# Patient Record
Sex: Male | Born: 1960 | Race: Black or African American | Hispanic: No | Marital: Married | Smoking: Current some day smoker
Health system: Southern US, Community
[De-identification: ages and names within clinical notes are randomized; demographics above are authoritative.]

## PROBLEM LIST (undated history)

## (undated) DIAGNOSIS — R7303 Prediabetes: Secondary | ICD-10-CM

## (undated) DIAGNOSIS — J449 Chronic obstructive pulmonary disease, unspecified: Secondary | ICD-10-CM

## (undated) DIAGNOSIS — E785 Hyperlipidemia, unspecified: Secondary | ICD-10-CM

## (undated) HISTORY — PX: CARPAL TUNNEL RELEASE: SHX101

## (undated) HISTORY — DX: Chronic obstructive pulmonary disease, unspecified: J44.9

## (undated) HISTORY — DX: Prediabetes: R73.03

## (undated) HISTORY — PX: ELBOW SURGERY: SHX618

## (undated) HISTORY — PX: APPENDECTOMY: SHX54

## (undated) HISTORY — DX: Hyperlipidemia, unspecified: E78.5

## (undated) HISTORY — PX: LUMBAR FUSION: SHX111

---

## 2009-12-06 ENCOUNTER — Inpatient Hospital Stay (HOSPITAL_COMMUNITY): Admission: RE | Admit: 2009-12-06 | Discharge: 2009-12-08 | Payer: Self-pay | Admitting: Neurosurgery

## 2010-01-09 ENCOUNTER — Encounter: Admission: RE | Admit: 2010-01-09 | Discharge: 2010-01-09 | Payer: Self-pay | Admitting: Neurosurgery

## 2010-04-17 ENCOUNTER — Encounter
Admission: RE | Admit: 2010-04-17 | Discharge: 2010-04-17 | Payer: Self-pay | Source: Home / Self Care | Attending: Neurosurgery | Admitting: Neurosurgery

## 2010-06-13 LAB — SURGICAL PCR SCREEN: Staphylococcus aureus: POSITIVE — AB

## 2010-06-13 LAB — CBC
MCH: 31.3 pg (ref 26.0–34.0)
MCHC: 35.1 g/dL (ref 30.0–36.0)
MCV: 89.2 fL (ref 78.0–100.0)
Platelets: 203 10*3/uL (ref 150–400)
RDW: 13.3 % (ref 11.5–15.5)

## 2010-09-01 ENCOUNTER — Other Ambulatory Visit: Payer: Self-pay | Admitting: Neurosurgery

## 2010-09-01 DIAGNOSIS — M5410 Radiculopathy, site unspecified: Secondary | ICD-10-CM

## 2010-09-04 ENCOUNTER — Other Ambulatory Visit: Payer: Self-pay

## 2010-09-04 ENCOUNTER — Inpatient Hospital Stay: Admission: RE | Admit: 2010-09-04 | Payer: Self-pay | Source: Ambulatory Visit

## 2012-02-25 ENCOUNTER — Other Ambulatory Visit: Payer: Self-pay | Admitting: Neurosurgery

## 2012-02-25 DIAGNOSIS — M5410 Radiculopathy, site unspecified: Secondary | ICD-10-CM

## 2012-03-15 ENCOUNTER — Ambulatory Visit
Admission: RE | Admit: 2012-03-15 | Discharge: 2012-03-15 | Disposition: A | Discharge status: Home / Self Care | Payer: Medicare Other | Source: Ambulatory Visit | Attending: Neurosurgery | Admitting: Neurosurgery

## 2012-03-15 DIAGNOSIS — M5410 Radiculopathy, site unspecified: Secondary | ICD-10-CM

## 2012-03-15 MED ORDER — GADOBENATE DIMEGLUMINE 529 MG/ML IV SOLN: 19.0000 mL | Freq: Once | INTRAVENOUS | Status: AC | PRN

## 2012-04-19 ENCOUNTER — Ambulatory Visit
Admission: RE | Admit: 2012-04-19 | Discharge: 2012-04-19 | Disposition: A | Discharge status: Home / Self Care | Payer: Medicare Other | Source: Ambulatory Visit | Attending: Neurosurgery | Admitting: Neurosurgery

## 2012-04-19 MED ORDER — GADOBENATE DIMEGLUMINE 529 MG/ML IV SOLN
18.0000 mL | Freq: Once | INTRAVENOUS | Status: AC | PRN
  Administered 2012-04-19: 18 mL via INTRAVENOUS

## 2012-11-26 ENCOUNTER — Other Ambulatory Visit: Payer: Self-pay | Admitting: Neurosurgery

## 2012-11-26 DIAGNOSIS — M549 Dorsalgia, unspecified: Secondary | ICD-10-CM

## 2013-02-09 ENCOUNTER — Other Ambulatory Visit: Payer: Medicare Other

## 2013-02-10 ENCOUNTER — Ambulatory Visit
Admission: RE | Admit: 2013-02-10 | Discharge: 2013-02-10 | Disposition: A | Discharge status: Home / Self Care | Payer: Medicare Other | Source: Ambulatory Visit | Attending: Neurosurgery | Admitting: Neurosurgery

## 2013-02-10 DIAGNOSIS — M549 Dorsalgia, unspecified: Secondary | ICD-10-CM

## 2013-08-20 ENCOUNTER — Encounter (HOSPITAL_COMMUNITY): Payer: Self-pay | Admitting: Emergency Medicine

## 2013-08-20 ENCOUNTER — Emergency Department (HOSPITAL_COMMUNITY)
Admission: EM | Admit: 2013-08-20 | Discharge: 2013-08-20 | Disposition: A | Discharge status: Home / Self Care | Payer: Medicare Other | Attending: Emergency Medicine | Admitting: Emergency Medicine

## 2013-08-20 DIAGNOSIS — S6990XA Unspecified injury of unspecified wrist, hand and finger(s), initial encounter: Secondary | ICD-10-CM

## 2013-08-20 DIAGNOSIS — IMO0002 Reserved for concepts with insufficient information to code with codable children: Secondary | ICD-10-CM | POA: Insufficient documentation

## 2013-08-20 DIAGNOSIS — Y9210 Unspecified residential institution as the place of occurrence of the external cause: Secondary | ICD-10-CM | POA: Insufficient documentation

## 2013-08-20 DIAGNOSIS — Z79899 Other long term (current) drug therapy: Secondary | ICD-10-CM | POA: Insufficient documentation

## 2013-08-20 DIAGNOSIS — S59919A Unspecified injury of unspecified forearm, initial encounter: Secondary | ICD-10-CM

## 2013-08-20 DIAGNOSIS — T148XXA Other injury of unspecified body region, initial encounter: Secondary | ICD-10-CM

## 2013-08-20 DIAGNOSIS — S4980XA Other specified injuries of shoulder and upper arm, unspecified arm, initial encounter: Secondary | ICD-10-CM | POA: Insufficient documentation

## 2013-08-20 DIAGNOSIS — S46909A Unspecified injury of unspecified muscle, fascia and tendon at shoulder and upper arm level, unspecified arm, initial encounter: Secondary | ICD-10-CM | POA: Insufficient documentation

## 2013-08-20 DIAGNOSIS — Y9389 Activity, other specified: Secondary | ICD-10-CM | POA: Insufficient documentation

## 2013-08-20 DIAGNOSIS — S59909A Unspecified injury of unspecified elbow, initial encounter: Secondary | ICD-10-CM | POA: Insufficient documentation

## 2013-08-20 DIAGNOSIS — X500XXA Overexertion from strenuous movement or load, initial encounter: Secondary | ICD-10-CM | POA: Insufficient documentation

## 2013-08-20 MED ORDER — METHOCARBAMOL 500 MG PO TABS: 500.0000 mg | ORAL_TABLET | Freq: Two times a day (BID) | ORAL | Status: DC

## 2013-08-20 MED ORDER — IBUPROFEN 800 MG PO TABS: 800.0000 mg | ORAL_TABLET | Freq: Three times a day (TID) | ORAL | Status: DC

## 2013-08-20 NOTE — ED Notes (Signed)
Reports that his son is in the hospital, he tried to pull his son up in bed and now having neck, back, arm, wrist pain. Ambulatory, no acute distress noted at triage.

## 2013-08-20 NOTE — Discharge Instructions (Signed)

## 2013-08-20 NOTE — ED Provider Notes (Signed)
Medical screening examination/treatment/procedure(s) were performed by non-physician practitioner and as supervising physician I was immediately available for consultation/collaboration.   EKG Interpretation None        Shanna Cisco, MD 08/20/13 2344

## 2013-08-20 NOTE — ED Provider Notes (Signed)
CSN: [id number]     Arrival date & time 08/20/13  1511 History   This chart was scribed for Fayrene HelperBowie Bohden Dung, PA-C, working with Shanna CiscoMegan E Docherty, MD by Blanchard KelchNicole Curnes, ED Scribe. This patient was seen in room TR05C/TR05C and the patient's care was started at 3:53 PM.     Chief Complaint  Patient presents with  . Back Pain  . Arm Pain      Patient is a 53 y.o. male presenting with back pain and arm pain. The history is provided by the patient. No language interpreter was used.  Back Pain Associated symptoms: no fever   Arm Pain    HPI Comments: Jackson Castillo is a 53 y.o. male who presents to the Emergency Department complaining of constant, worsening mid back and left arm pain that began after he pulled his son up from a hospital bed three days ago. He states that he was trying to adjust his son so he could sit higher three days ago and two days ago by pulling the son from behind the bed. He states that he had mild pain that severely worsened this morning upon waking in his back and left arm. He describes the pain as sore and tight. The pain is aggravated by holding his arm straight down. He denies taking any medication for the pain. He denies any pertinent past medical history. He has a surgical history of lower back pain. He denies any allergies.    History reviewed. No pertinent past medical history. History reviewed. No pertinent past surgical history. History reviewed. No pertinent family history. History  Substance Use Topics  . Smoking status: Not on file  . Smokeless tobacco: Not on file  . Alcohol Use: No    Review of Systems  Constitutional: Negative for fever and chills.  Musculoskeletal: Positive for back pain and myalgias.      Allergies  Review of patient's allergies indicates no known allergies.  Home Medications   Prior to Admission medications   Medication Sig Start Date End Date Taking? Authorizing Provider  atorvastatin (LIPITOR) 10 MG tablet Take 10 mg by  mouth daily.   Yes Historical Provider, MD  traZODone (DESYREL) 150 MG tablet Take 150 mg by mouth at bedtime.   Yes Historical Provider, MD   Triage Vitals: BP 129/94  Pulse 90  Temp(Src) 97.4 F (36.3 C) (Oral)  Resp 18  Ht 6' (1.829 m)  Wt 180 lb (81.647 kg)  BMI 24.41 kg/m2  SpO2 98%  Physical Exam  Nursing note and vitals reviewed. Constitutional: He is oriented to person, place, and time. He appears well-developed and well-nourished. No distress.  HENT:  Head: Normocephalic and atraumatic.  Eyes: EOM are normal.  Neck: Neck supple. No tracheal deviation present.  Cardiovascular: Normal rate.   Pulmonary/Chest: Effort normal. No respiratory distress.  Musculoskeletal: Normal range of motion. He exhibits tenderness.  Left elbow tenderness to anterior fossa that is worse with elbow extension. Tenderness to insertions of biceps brachii. Tenderness to left deltoid muscle. Tenderness to thoracic and parathoracic spinal muscles. No crepitus or stepoffs throughout mid spine.   Neurological: He is alert and oriented to person, place, and time.  Skin: Skin is warm and dry.  Psychiatric: He has a normal mood and affect. His behavior is normal.    ED Course  Procedures (including critical care time)  DIAGNOSTIC STUDIES: Oxygen Saturation is 98% on room air, normal by my interpretation.    COORDINATION OF CARE: 3:59 PM -Will discharge with  prescription for muscle relaxant. Recommend follow up with Orthopedics. Patient verbalizes understanding and agrees with treatment plan.    Labs Review Labs Reviewed - No data to display  Imaging Review No results found.   EKG Interpretation None      MDM   Final diagnoses:  Muscle strain    BP 129/94  Pulse 90  Temp(Src) 97.4 F (36.3 C) (Oral)  Resp 18  Ht 6' (1.829 m)  Wt 180 lb (81.647 kg)  BMI 24.41 kg/m2  SpO2 98%   I personally performed the services described in this documentation, which was scribed in my  presence. The recorded information has been reviewed and is accurate.     Fayrene Helper, PA-C 08/20/13 1629

## 2013-09-06 DIAGNOSIS — M5136 Other intervertebral disc degeneration, lumbar region: Secondary | ICD-10-CM | POA: Insufficient documentation

## 2013-09-06 DIAGNOSIS — M5030 Other cervical disc degeneration, unspecified cervical region: Secondary | ICD-10-CM | POA: Insufficient documentation

## 2013-12-15 ENCOUNTER — Ambulatory Visit (HOSPITAL_COMMUNITY): Payer: Self-pay | Admitting: Physician Assistant

## 2014-09-05 IMAGING — CT CT L SPINE W/O CM
4 of 10 series · 12 of 33 positions shown, 14 images · non-contrast
Comparison: Lumbar MRI 04/19/2012

CLINICAL DATA: Low back pain with right-sided radiculopathy. Lumbar
fusion

EXAM:
CT LUMBAR SPINE WITHOUT CONTRAST
TECHNIQUE: Multidetector CT imaging of the lumbar spine was performed without
intravenous contrast administration. Multiplanar CT image
reconstructions were also generated.

[Series 4: l spine bone · axial · 0.30mm/px · z∈[-17,+63]mm · 2 of 97 slices shown, 3 images]
[im 33/97  soft-tissue]
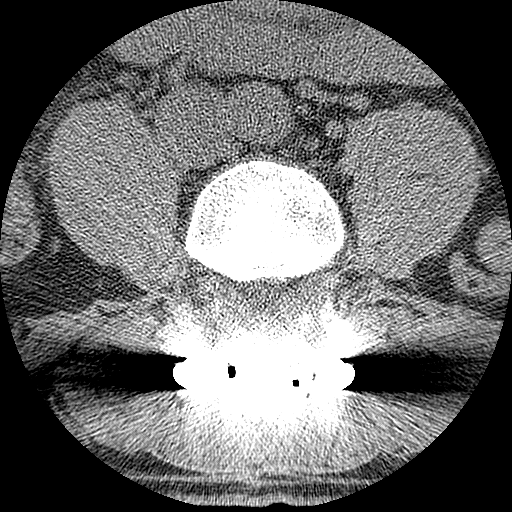
[im 33/97  bone]
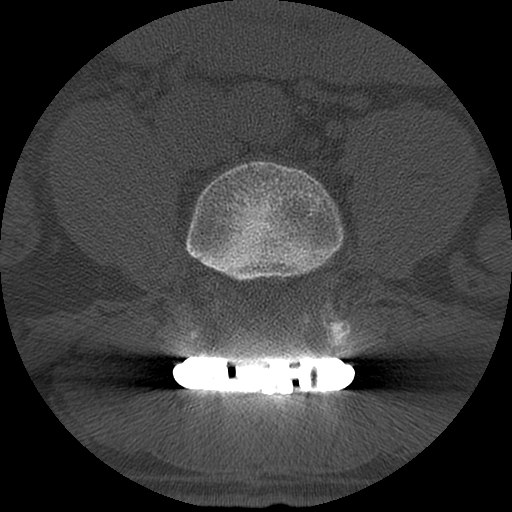
[im 65/97  bone]
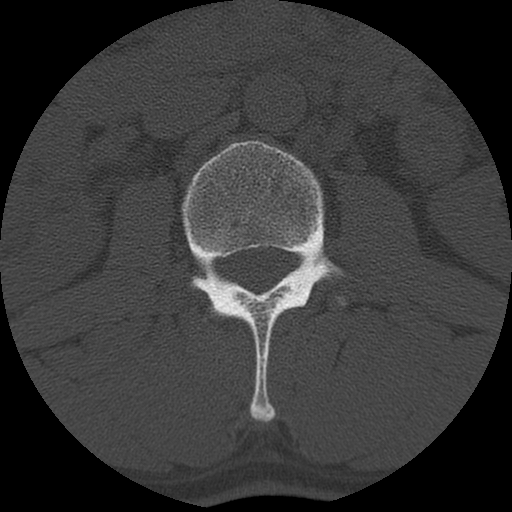

[Series 5: l spine detail · axial · 0.30mm/px · z∈[-19,+61]mm · 2 of 98 slices shown]
[im 33/98  bone]
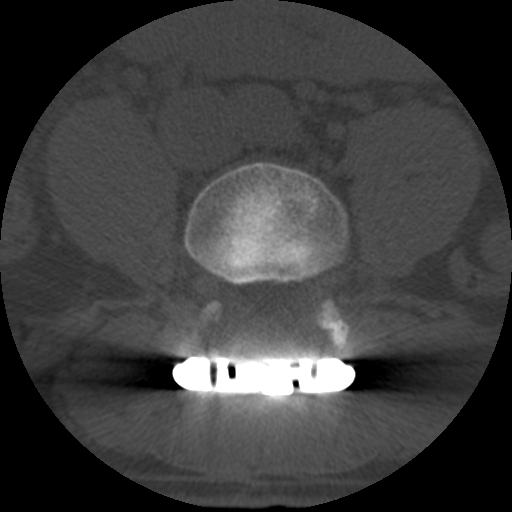
[im 65/98  bone]
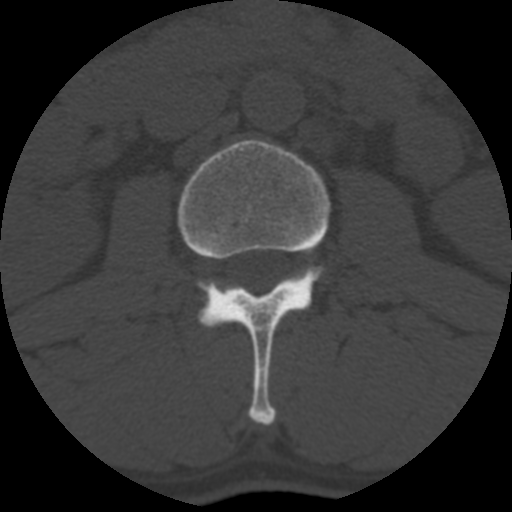

[Series 200: cor · coronal · 0.49mm/px · 3 of 58 slices shown]
[im 12/58  bone]
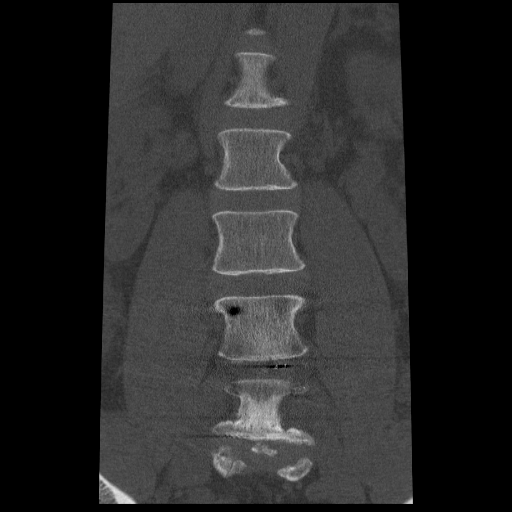
[im 23/58  bone]
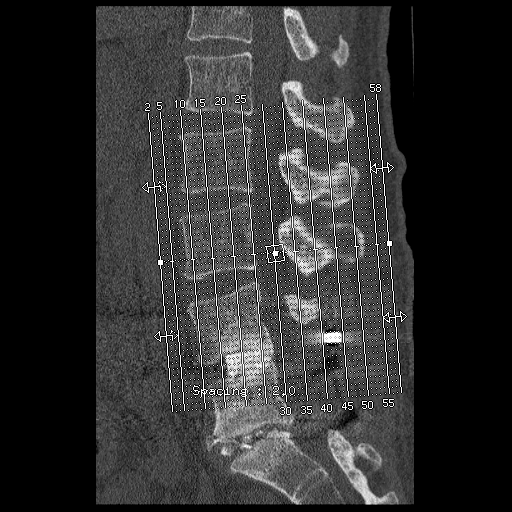
[im 35/58  bone]
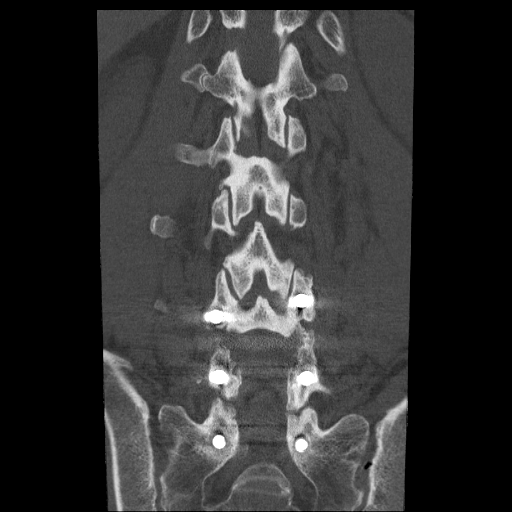

[Series 201: sag · sagittal · 0.49mm/px · 5 of 48 slices shown, 6 images]
[im 16/48  bone]
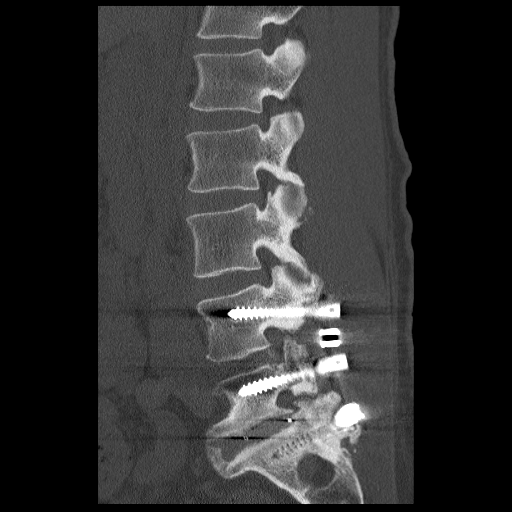
[im 20/48  bone]
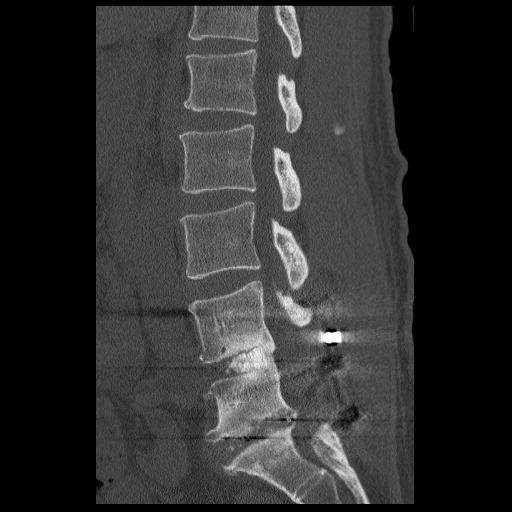
[im 24/48  soft-tissue]
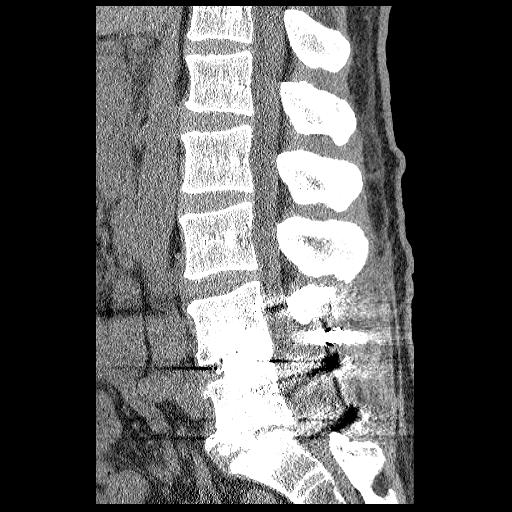
[im 24/48  bone]
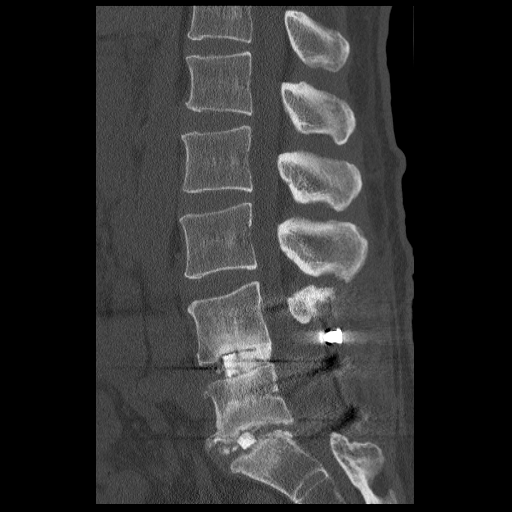
[im 28/48  bone]
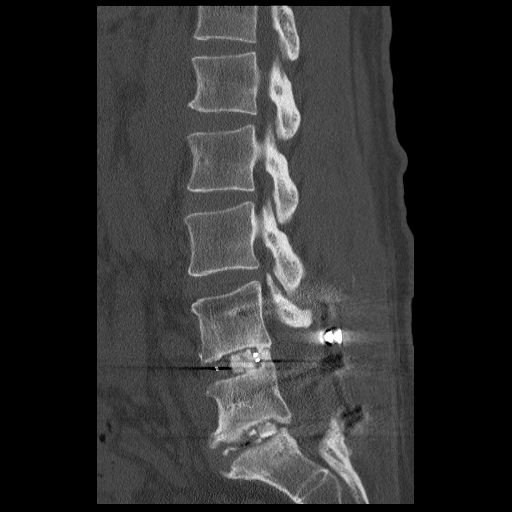
[im 32/48  bone]
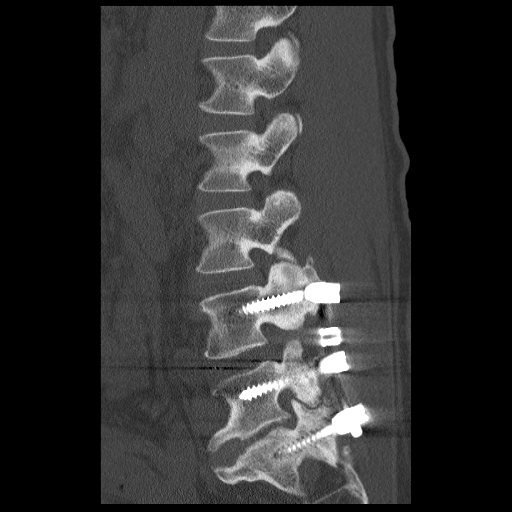

[12 of 33 positions shown; findings below may reference images not displayed]

FINDINGS: Negative for fracture. Normal alignment. Fusion at L4-5 and L5-S1.
No hardware fracture. Hardware positioning is satisfactory.

L1-2: Disc bulging and mild facet hypertrophy with mild spinal
stenosis.

L2-3: Mild disc bulging and mild facet hypertrophy with mild spinal
stenosis.

L3-4: Disc bulging and moderate facet hypertrophy causing moderate
spinal stenosis. Progression of facet degeneration and stenosis
since the prior MRI.

L4-5: Pedicle screw and interbody fusion. Solid interbody fusion
with solid bony bridging noted.

L5-S1: Mild lucency around the left L5 screw. Question early bony
bridging on the right disc space. Mild foraminal narrowing
bilaterally due to spurring.
IMPRESSION: Progression of disc and facet degeneration at L3-4 with moderate
stenosis.

Solid interbody fusion L4-5.

Mild lucency around left L5 screw which may indicate loosening.
Question early bony bridging on the right. Solid bony fusion cannot
be definitely established at L5-S1.

## 2015-06-05 DIAGNOSIS — M75102 Unspecified rotator cuff tear or rupture of left shoulder, not specified as traumatic: Secondary | ICD-10-CM | POA: Insufficient documentation

## 2019-06-02 DIAGNOSIS — R7303 Prediabetes: Secondary | ICD-10-CM | POA: Insufficient documentation

## 2019-06-02 DIAGNOSIS — J4520 Mild intermittent asthma, uncomplicated: Secondary | ICD-10-CM | POA: Insufficient documentation

## 2019-06-02 DIAGNOSIS — F5101 Primary insomnia: Secondary | ICD-10-CM | POA: Insufficient documentation

## 2019-06-02 DIAGNOSIS — E785 Hyperlipidemia, unspecified: Secondary | ICD-10-CM | POA: Insufficient documentation

## 2019-06-02 DIAGNOSIS — J449 Chronic obstructive pulmonary disease, unspecified: Secondary | ICD-10-CM | POA: Insufficient documentation

## 2019-06-22 ENCOUNTER — Encounter: Payer: Self-pay | Admitting: Gastroenterology

## 2019-07-18 ENCOUNTER — Ambulatory Visit: Payer: Self-pay | Admitting: Family Medicine

## 2019-07-21 ENCOUNTER — Encounter: Payer: Medicare Other | Admitting: Gastroenterology

## 2019-07-27 ENCOUNTER — Encounter: Payer: Self-pay | Admitting: Family Medicine

## 2019-07-27 ENCOUNTER — Other Ambulatory Visit: Payer: Self-pay

## 2019-07-27 ENCOUNTER — Ambulatory Visit (INDEPENDENT_AMBULATORY_CARE_PROVIDER_SITE_OTHER): Payer: Medicare Other | Admitting: Family Medicine

## 2019-07-27 VITALS — BP 128/87 | HR 74 | Temp 97.8°F | Ht 72.0 in | Wt 188.0 lb

## 2019-07-27 DIAGNOSIS — R7303 Prediabetes: Secondary | ICD-10-CM | POA: Diagnosis not present

## 2019-07-27 DIAGNOSIS — E785 Hyperlipidemia, unspecified: Secondary | ICD-10-CM | POA: Diagnosis not present

## 2019-07-27 DIAGNOSIS — G894 Chronic pain syndrome: Secondary | ICD-10-CM

## 2019-07-27 DIAGNOSIS — F329 Major depressive disorder, single episode, unspecified: Secondary | ICD-10-CM

## 2019-07-27 DIAGNOSIS — K644 Residual hemorrhoidal skin tags: Secondary | ICD-10-CM

## 2019-07-27 DIAGNOSIS — Z1211 Encounter for screening for malignant neoplasm of colon: Secondary | ICD-10-CM

## 2019-07-27 DIAGNOSIS — F32A Depression, unspecified: Secondary | ICD-10-CM

## 2019-07-27 DIAGNOSIS — J449 Chronic obstructive pulmonary disease, unspecified: Secondary | ICD-10-CM

## 2019-07-27 MED ORDER — ATORVASTATIN CALCIUM 10 MG PO TABS: 10.0000 mg | ORAL_TABLET | Freq: Every day | ORAL | 1 refills | Status: DC

## 2019-07-27 MED ORDER — TRAZODONE HCL 100 MG PO TABS: 100.0000 mg | ORAL_TABLET | Freq: Every day | ORAL | 1 refills | Status: DC

## 2019-07-27 NOTE — Progress Notes (Signed)
Subjective:  Patient ID: Jackson Castillo, male    DOB: [birth year]  Age: 59 y.o. MRN: [id number]  CC:  Chief Complaint  Patient presents with  . Establish Care    pt states as far as his general health he feels " reall good". pt reports he dose have prediabetes    HPI Jackson Castillo presents for   Presents to establish care.  Previously treated by [facility], note reviewed from March 25. Did not feel like last office right fit.   Had negative/nonreactive HIV and hep C tests at other office in January. 2nd covid vaccine in 2 weeks.   History of chronic neck and back pain.   Per review of prior note, history of back surgery in 2011 with hardware in low back.  Reportedly had a pinched nerve few years ago, surgery recommended but declined.  Temporary prescription for oxycodone 7.5/325 was given and referral to pain clinic.   Controlled substance database (PDMP) reviewed.  Oxycodone 7.5/325 mg prescription filled on March 25 for #15, previously January 22 for #30, previously January 14, 2019 for #90, December 16, 2018 #90.  Reports referral to pain mgt from [facility] was in [place] difficulty with transportation to other county as public transportation.   X-rays reviewed from 06/23/2019  lumbar spine x-ray,  fixation L4-S1, mild degenerative disc disease Cervical spine x-ray, no fracture or subluxation or prevertebral edema.  Multilevel degenerative disc disease most pronounced at C4-5.  Prior pain mgt- Dr Emmaline Life - over 2 years ago. Followed by other provider after Dr. Tyler Deis left. just didn't feel the same - did not want to continue follow up with new provider.   Depression: Stable with trazodone and remeron at bedtime. Sleeping well. No daytime somnolence, sleeps well. No SI. Had decreased dose of trazodone by psychiatry recently to 100mg  in February - still working well.  [facility] in [place].  Option of follow up with PCP  for depression meds.   Depression screen [facility] 2/9 07/27/2019  Decreased Interest 0  Down, Depressed, Hopeless 1  PHQ - 2 Score 1   External Hemorrhoids.  Not painful at present, rarely scratches with wiping. Occasional  Constipation 2x/month.  Due for colonoscopy this year - [facility].   Prediabetes: A1c 5.7 on March 4 Trying to watch diet/exercise/walking. Cut back on soda/sweet tea. Light lemonade at times.   COPD: Noted 10 years ago - has inhaler as needed only.albuterol? used about 2-3 times per week. Some increase use during pollen season.  Quit 2 months ago. Doing ok. Prior off and on - can go years without smoking. 1/3ppd when smoking.   Hyperlipidemia: Lipitor 10 mg daily.   Last lipid panel on March 4 was normal at [facility] with total cholesterol 125, triglycerides 49, HDL 61, LDL 53.   History There are no problems to display for this patient.  Past Medical History:  Diagnosis Date  . COPD (chronic obstructive pulmonary disease) (HCC)   . Hyperlipidemia   . Prediabetes    Past Surgical History:  Procedure Laterality Date  . SPINE SURGERY     Allergies  Allergen Reactions  . Sm Non-Asprin Sinus [Pseudoephedrine-Acetaminophen] Nausea Only   Prior to Admission medications   Medication Sig Start Date End Date Taking? Authorizing Provider  atorvastatin (LIPITOR) 10 MG tablet Take 10 mg by mouth daily.   Yes [provider]  mirtazapine (REMERON) 45 MG tablet Take 45 mg by mouth at  bedtime. 07/18/19  Yes [provider]  oxyCODONE-acetaminophen (PERCOCET) 7.5-325 MG tablet TAKE 1 TABLET BY MOUTH TWICE A DAY AS NEEDED FOR PAIN 05/08/17  Yes [provider]  traZODone (DESYREL) 150 MG tablet Take 150 mg by mouth at bedtime.   Yes [provider]  ibuprofen (ADVIL,MOTRIN) 800 MG tablet Take 1 tablet (800 mg total) by mouth 3 (three) times daily. Patient not taking: Reported on 07/27/2019 08/20/13   Fayrene Helperran, [place], PA-C    methocarbamol (ROBAXIN) 500 MG tablet Take 1 tablet (500 mg total) by mouth 2 (two) times daily. Patient not taking: Reported on 07/27/2019 08/20/13   Fayrene Helperran, [place], PA-C   Social History   Socioeconomic History  . Marital status: Married    Spouse name: Not on file  . Number of children: Not on file  . Years of education: Not on file  . Highest education level: Not on file  Occupational History  . Not on file  Tobacco Use  . Smoking status: Never Smoker  . Smokeless tobacco: Never Used  Substance and Sexual Activity  . Alcohol use: No  . Drug use: No  . Sexual activity: Yes  Other Topics Concern  . Not on file  Social History Narrative  . Not on file   Social Determinants of Health   Financial Resource Strain:   . Difficulty of Paying Living Expenses:   Food Insecurity:   . Worried About Programme researcher, broadcasting/film/videounning Out of Food in the Last Year:   . Baristaan Out of Food in the Last Year:   Transportation Needs:   . Freight forwarderLack of Transportation (Medical):   Marland Kitchen. Lack of Transportation (Non-Medical):   Physical Activity:   . Days of Exercise per Week:   . Minutes of Exercise per Session:   Stress:   . Feeling of Stress :   Social Connections:   . Frequency of Communication with Friends and Family:   . Frequency of Social Gatherings with Friends and Family:   . Attends Religious Services:   . Active Member of Clubs or Organizations:   . Attends BankerClub or Organization Meetings:   Marland Kitchen. Marital Status:   Intimate Partner Violence:   . Fear of Current or Ex-Partner:   . Emotionally Abused:   Marland Kitchen. Physically Abused:   . Sexually Abused:     Review of Systems Per HPI.   Objective:   Vitals:   07/27/19 1020  BP: 128/87  Pulse: 74  Temp: 97.8 F (36.6 C)  TempSrc: Temporal  SpO2: 97%  Weight: 188 lb (85.3 kg)  Height: 6' (1.829 m)     Physical Exam Vitals reviewed.  Constitutional:      Appearance: He is well-developed.  HENT:     Head: Normocephalic and atraumatic.  Eyes:     Pupils: Pupils  are equal, round, and reactive to light.  Neck:     Vascular: No carotid bruit or JVD.  Cardiovascular:     Rate and Rhythm: Normal rate and regular rhythm.     Heart sounds: Normal heart sounds. No murmur.  Pulmonary:     Effort: Pulmonary effort is normal.     Breath sounds: Normal breath sounds. No rales.  Skin:    General: Skin is warm and dry.  Neurological:     Mental Status: He is alert and oriented to person, place, and time.        Assessment & Plan:  Jackson Castillo is a 59 y.o. male . Depression, unspecified depression type - Plan:  traZODone (DESYREL) 100 MG tablet  - continue trazodone, remeron same dose at this time.   Special screening for malignant neoplasms, colon  - follow up with prior GI - pt to call. Referral if needed.   Hyperlipidemia, unspecified hyperlipidemia type - Plan: atorvastatin (LIPITOR) 10 MG tablet  -  Stable, tolerating current regimen. Medications refilled. Labs recenty noted.  Prediabetes  - commended on diet changes. Recheck A1c at 6 months.   Chronic pain syndrome - Plan: Ambulatory referral to Pain Clinic  - refer for ongoing care, med discussion. New practice requested.   Chronic obstructive pulmonary disease, unspecified COPD type (HCC)  - has albuterol. Consider spiriva. Plan for follow up in 2 weeks.   External hemorrhoids  - fiber, fluids in diet, handout given with RTC precautions.   Meds ordered this encounter  Medications  . traZODone (DESYREL) 100 MG tablet    Sig: Take 1 tablet (100 mg total) by mouth at bedtime.    Dispense:  90 tablet    Refill:  1  . atorvastatin (LIPITOR) 10 MG tablet    Sig: Take 1 tablet (10 mg total) by mouth daily.    Dispense:  90 tablet    Refill:  1   Patient Instructions    Bring inhaler to next visit as well as other meds to make sure we refill it appropriately.  Continue to avoid smoking - keep up the good work.   Cholesterol looked good on last labs.   Fiber like citrucel and  plenty of fluids can help prevent constipation, stool softener like Colace if needed. See info on hemorrhoids.  I placed a new referral for pain management.   Return to the clinic or go to the nearest emergency room if any of your symptoms worsen or new symptoms occur.    Hemorrhoids Hemorrhoids are swollen veins in and around the rectum or anus. There are two types of hemorrhoids:  Internal hemorrhoids. These occur in the veins that are just inside the rectum. They may poke through to the outside and become irritated and painful.  External hemorrhoids. These occur in the veins that are outside the anus and can be felt as a painful swelling or hard lump near the anus. Most hemorrhoids do not cause serious problems, and they can be managed with home treatments such as diet and lifestyle changes. If home treatments do not help the symptoms, procedures can be done to shrink or remove the hemorrhoids. What are the causes? This condition is caused by increased pressure in the anal area. This pressure may result from various things, including:  Constipation.  Straining to have a bowel movement.  Diarrhea.  Pregnancy.  Obesity.  Sitting for long periods of time.  Heavy lifting or other activity that causes you to strain.  Anal sex.  Riding a bike for a long period of time. What are the signs or symptoms? Symptoms of this condition include:  Pain.  Anal itching or irritation.  Rectal bleeding.  Leakage of stool (feces).  Anal swelling.  One or more lumps around the anus. How is this diagnosed? This condition can often be diagnosed through a visual exam. Other exams or tests may also be done, such as:  An exam that involves feeling the rectal area with a gloved hand (digital rectal exam).  An exam of the anal canal that is done using a small tube (anoscope).  A blood test, if you have lost a significant amount of blood.  A test  to look inside the colon using a  flexible tube with a camera on the end (sigmoidoscopy or colonoscopy). How is this treated? This condition can usually be treated at home. However, various procedures may be done if dietary changes, lifestyle changes, and other home treatments do not help your symptoms. These procedures can help make the hemorrhoids smaller or remove them completely. Some of these procedures involve surgery, and others do not. Common procedures include:  Rubber band ligation. Rubber bands are placed at the base of the hemorrhoids to cut off their blood supply.  Sclerotherapy. Medicine is injected into the hemorrhoids to shrink them.  Infrared coagulation. A type of light energy is used to get rid of the hemorrhoids.  Hemorrhoidectomy surgery. The hemorrhoids are surgically removed, and the veins that supply them are tied off.  Stapled hemorrhoidopexy surgery. The surgeon staples the base of the hemorrhoid to the rectal wall. Follow these instructions at home: Eating and drinking   Eat foods that have a lot of fiber in them, such as whole grains, beans, nuts, fruits, and vegetables.  Ask your health care provider about taking products that have added fiber (fiber supplements).  Reduce the amount of fat in your diet. You can do this by eating low-fat dairy products, eating less red meat, and avoiding processed foods.  Drink enough fluid to keep your urine pale yellow. Managing pain and swelling   Take warm sitz baths for 20 minutes, 3-4 times a day to ease pain and discomfort. You may do this in a bathtub or using a portable sitz bath that fits over the toilet.  If directed, apply ice to the affected area. Using ice packs between sitz baths may be helpful. ? Put ice in a plastic bag. ? Place a towel between your skin and the bag. ? Leave the ice on for 20 minutes, 2-3 times a day. General instructions  Take over-the-counter and prescription medicines only as told by your health care provider.  Use  medicated creams or suppositories as told.  Get regular exercise. Ask your health care provider how much and what kind of exercise is best for you. In general, you should do moderate exercise for at least 30 minutes on most days of the week (150 minutes each week). This can include activities such as walking, biking, or yoga.  Go to the bathroom when you have the urge to have a bowel movement. Do not wait.  Avoid straining to have bowel movements.  Keep the anal area dry and clean. Use wet toilet paper or moist towelettes after a bowel movement.  Do not sit on the toilet for long periods of time. This increases blood pooling and pain.  Keep all follow-up visits as told by your health care provider. This is important. Contact a health care provider if you have:  Increasing pain and swelling that are not controlled by treatment or medicine.  Difficulty having a bowel movement, or you are unable to have a bowel movement.  Pain or inflammation outside the area of the hemorrhoids. Get help right away if you have:  Uncontrolled bleeding from your rectum. Summary  Hemorrhoids are swollen veins in and around the rectum or anus.  Most hemorrhoids can be managed with home treatments such as diet and lifestyle changes.  Taking warm sitz baths can help ease pain and discomfort.  In severe cases, procedures or surgery can be done to shrink or remove the hemorrhoids. This information is not intended to replace advice given  to you by your health care provider. Make sure you discuss any questions you have with your health care provider. Document Revised: 08/13/2018 Document Reviewed: 08/06/2017 Elsevier Patient Education  2020 [employer].    Coping with Quitting Smoking  Quitting smoking is a physical and mental challenge. You will face cravings, withdrawal symptoms, and temptation. Before quitting, work with your health care provider to make a plan that can help you cope. Preparation can  help you quit and keep you from giving in. How can I cope with cravings? Cravings usually last for 5-10 minutes. If you get through it, the craving will pass. Consider taking the following actions to help you cope with cravings:  Keep your mouth busy: ? Chew sugar-free gum. ? Suck on hard candies or a straw. ? Brush your teeth.  Keep your hands and body busy: ? Immediately change to a different activity when you feel a craving. ? Squeeze or play with a ball. ? Do an activity or a hobby, like making bead jewelry, practicing needlepoint, or working with wood. ? Mix up your normal routine. ? Take a short exercise break. Go for a quick walk or run up and down stairs. ? Spend time in public places where smoking is not allowed.  Focus on doing something kind or helpful for someone else.  Call a friend or family member to talk during a craving.  Join a support group.  Call a quit line, such as 1-800-QUIT-NOW.  Talk with your health care provider about medicines that might help you cope with cravings and make quitting easier for you. How can I deal with withdrawal symptoms? Your body may experience negative effects as it tries to get used to not having nicotine in the system. These effects are called withdrawal symptoms. They may include:  Feeling hungrier than normal.  Trouble concentrating.  Irritability.  Trouble sleeping.  Feeling depressed.  Restlessness and agitation.  Craving a cigarette. To manage withdrawal symptoms:  Avoid places, people, and activities that trigger your cravings.  Remember why you want to quit.  Get plenty of sleep.  Avoid coffee and other caffeinated drinks. These may worsen some of your symptoms. How can I handle social situations? Social situations can be difficult when you are quitting smoking, especially in the first few weeks. To manage this, you can:  Avoid parties, bars, and other social situations where people might be  smoking.  Avoid alcohol.  Leave right away if you have the urge to smoke.  Explain to your family and friends that you are quitting smoking. Ask for understanding and support.  Plan activities with friends or family where smoking is not an option. What are some ways I can cope with stress? Wanting to smoke may cause stress, and stress can make you want to smoke. Find ways to manage your stress. Relaxation techniques can help. For example:  Breathe slowly and deeply, in through your nose and out through your mouth.  Listen to soothing, relaxing music.  Talk with a family member or friend about your stress.  Light a candle.  Soak in a bath or take a shower.  Think about a peaceful place. What are some ways I can prevent weight gain? Be aware that many people gain weight after they quit smoking. However, not everyone does. To keep from gaining weight, have a plan in place before you quit and stick to the plan after you quit. Your plan should include:  Having healthy snacks. When you have a  craving, it may help to: ? Eat plain popcorn, crunchy carrots, celery, or other cut vegetables. ? Chew sugar-free gum.  Changing how you eat: ? Eat small portion sizes at meals. ? Eat 4-6 small meals throughout the day instead of 1-2 large meals a day. ? Be mindful when you eat. Do not watch television or do other things that might distract you as you eat.  Exercising regularly: ? Make time to exercise each day. If you do not have time for a long workout, do short bouts of exercise for 5-10 minutes several times a day. ? Do some form of strengthening exercise, like weight lifting, and some form of aerobic exercise, like running or swimming.  Drinking plenty of water or other low-calorie or no-calorie drinks. Drink 6-8 glasses of water daily, or as much as instructed by your health care provider. Summary  Quitting smoking is a physical and mental challenge. You will face cravings, withdrawal  symptoms, and temptation to smoke again. Preparation can help you as you go through these challenges.  You can cope with cravings by keeping your mouth busy (such as by chewing gum), keeping your body and hands busy, and making calls to family, friends, or a helpline for people who want to quit smoking.  You can cope with withdrawal symptoms by avoiding places where people smoke, avoiding drinks with caffeine, and getting plenty of rest.  Ask your health care provider about the different ways to prevent weight gain, avoid stress, and handle social situations. This information is not intended to replace advice given to you by your health care provider. Make sure you discuss any questions you have with your health care provider. Document Revised: 02/27/2017 Document Reviewed: 03/14/2016 Elsevier Patient Education  [employer].      If you have lab work done today you will be contacted with your lab results within the next 2 weeks.  If you have not heard from [place] then please contact us. The fastest way to get your results is to register for My Chart.   IF you received an x-ray today, you will receive an invoice from [facility]. Please contact [facility] at [phone] with questions or concerns regarding your invoice.   IF you received labwork today, you will receive an invoice from [facility]. Please contact [facility] at [phone] with questions or concerns regarding your invoice.   Our billing staff will not be able to assist you with questions regarding bills from these companies.  You will be contacted with the lab results as soon as they are available. The fastest way to get your results is to activate your My Chart account. Instructions are located on the last page of this paperwork. If you have not heard from [place] regarding the results in 2 weeks, please contact this office.         Signed, Merri Ray, MD [facility]

## 2019-07-27 NOTE — Patient Instructions (Addendum)
Bring inhaler to next visit as well as other meds to make sure we refill it appropriately.  Continue to avoid smoking - keep up the good work.   Cholesterol looked good on last labs.   Fiber like citrucel and plenty of fluids can help prevent constipation, stool softener like Colace if needed. See info on hemorrhoids.  I placed a new referral for pain management.   Return to the clinic or go to the nearest emergency room if any of your symptoms worsen or new symptoms occur.    Hemorrhoids Hemorrhoids are swollen veins in and around the rectum or anus. There are two types of hemorrhoids:  Internal hemorrhoids. These occur in the veins that are just inside the rectum. They may poke through to the outside and become irritated and painful.  External hemorrhoids. These occur in the veins that are outside the anus and can be felt as a painful swelling or hard lump near the anus. Most hemorrhoids do not cause serious problems, and they can be managed with home treatments such as diet and lifestyle changes. If home treatments do not help the symptoms, procedures can be done to shrink or remove the hemorrhoids. What are the causes? This condition is caused by increased pressure in the anal area. This pressure may result from various things, including:  Constipation.  Straining to have a bowel movement.  Diarrhea.  Pregnancy.  Obesity.  Sitting for long periods of time.  Heavy lifting or other activity that causes you to strain.  Anal sex.  Riding a bike for a long period of time. What are the signs or symptoms? Symptoms of this condition include:  Pain.  Anal itching or irritation.  Rectal bleeding.  Leakage of stool (feces).  Anal swelling.  One or more lumps around the anus. How is this diagnosed? This condition can often be diagnosed through a visual exam. Other exams or tests may also be done, such as:  An exam that involves feeling the rectal area with a gloved  hand (digital rectal exam).  An exam of the anal canal that is done using a small tube (anoscope).  A blood test, if you have lost a significant amount of blood.  A test to look inside the colon using a flexible tube with a camera on the end (sigmoidoscopy or colonoscopy). How is this treated? This condition can usually be treated at home. However, various procedures may be done if dietary changes, lifestyle changes, and other home treatments do not help your symptoms. These procedures can help make the hemorrhoids smaller or remove them completely. Some of these procedures involve surgery, and others do not. Common procedures include:  Rubber band ligation. Rubber bands are placed at the base of the hemorrhoids to cut off their blood supply.  Sclerotherapy. Medicine is injected into the hemorrhoids to shrink them.  Infrared coagulation. A type of light energy is used to get rid of the hemorrhoids.  Hemorrhoidectomy surgery. The hemorrhoids are surgically removed, and the veins that supply them are tied off.  Stapled hemorrhoidopexy surgery. The surgeon staples the base of the hemorrhoid to the rectal wall. Follow these instructions at home: Eating and drinking   Eat foods that have a lot of fiber in them, such as whole grains, beans, nuts, fruits, and vegetables.  Ask your health care provider about taking products that have added fiber (fiber supplements).  Reduce the amount of fat in your diet. You can do this by eating low-fat dairy products, eating  less red meat, and avoiding processed foods.  Drink enough fluid to keep your urine pale yellow. Managing pain and swelling   Take warm sitz baths for 20 minutes, 3-4 times a day to ease pain and discomfort. You may do this in a bathtub or using a portable sitz bath that fits over the toilet.  If directed, apply ice to the affected area. Using ice packs between sitz baths may be helpful. ? Put ice in a plastic bag. ? Place a towel  between your skin and the bag. ? Leave the ice on for 20 minutes, 2-3 times a day. General instructions  Take over-the-counter and prescription medicines only as told by your health care provider.  Use medicated creams or suppositories as told.  Get regular exercise. Ask your health care provider how much and what kind of exercise is best for you. In general, you should do moderate exercise for at least 30 minutes on most days of the week (150 minutes each week). This can include activities such as walking, biking, or yoga.  Go to the bathroom when you have the urge to have a bowel movement. Do not wait.  Avoid straining to have bowel movements.  Keep the anal area dry and clean. Use wet toilet paper or moist towelettes after a bowel movement.  Do not sit on the toilet for long periods of time. This increases blood pooling and pain.  Keep all follow-up visits as told by your health care provider. This is important. Contact a health care provider if you have:  Increasing pain and swelling that are not controlled by treatment or medicine.  Difficulty having a bowel movement, or you are unable to have a bowel movement.  Pain or inflammation outside the area of the hemorrhoids. Get help right away if you have:  Uncontrolled bleeding from your rectum. Summary  Hemorrhoids are swollen veins in and around the rectum or anus.  Most hemorrhoids can be managed with home treatments such as diet and lifestyle changes.  Taking warm sitz baths can help ease pain and discomfort.  In severe cases, procedures or surgery can be done to shrink or remove the hemorrhoids. This information is not intended to replace advice given to you by your health care provider. Make sure you discuss any questions you have with your health care provider. Document Revised: 08/13/2018 Document Reviewed: 08/06/2017 Elsevier Patient Education  2020 [place] with Quitting Smoking  Quitting  smoking is a physical and mental challenge. You will face cravings, withdrawal symptoms, and temptation. Before quitting, work with your health care provider to make a plan that can help you cope. Preparation can help you quit and keep you from giving in. How can I cope with cravings? Cravings usually last for 5-10 minutes. If you get through it, the craving will pass. Consider taking the following actions to help you cope with cravings:  Keep your mouth busy: ? Chew sugar-free gum. ? Suck on hard candies or a straw. ? Brush your teeth.  Keep your hands and body busy: ? Immediately change to a different activity when you feel a craving. ? Squeeze or play with a ball. ? Do an activity or a hobby, like making bead jewelry, practicing needlepoint, or working with wood. ? Mix up your normal routine. ? Take a short exercise break. Go for a quick walk or run up and down stairs. ? Spend time in public places where smoking is not allowed.  Focus on  doing something kind or helpful for someone else.  Call a friend or family member to talk during a craving.  Join a support group.  Call a quit line, such as 1-800-QUIT-NOW.  Talk with your health care provider about medicines that might help you cope with cravings and make quitting easier for you. How can I deal with withdrawal symptoms? Your body may experience negative effects as it tries to get used to not having nicotine in the system. These effects are called withdrawal symptoms. They may include:  Feeling hungrier than normal.  Trouble concentrating.  Irritability.  Trouble sleeping.  Feeling depressed.  Restlessness and agitation.  Craving a cigarette. To manage withdrawal symptoms:  Avoid places, people, and activities that trigger your cravings.  Remember why you want to quit.  Get plenty of sleep.  Avoid coffee and other caffeinated drinks. These may worsen some of your symptoms. How can I handle social  situations? Social situations can be difficult when you are quitting smoking, especially in the first few weeks. To manage this, you can:  Avoid parties, bars, and other social situations where people might be smoking.  Avoid alcohol.  Leave right away if you have the urge to smoke.  Explain to your family and friends that you are quitting smoking. Ask for understanding and support.  Plan activities with friends or family where smoking is not an option. What are some ways I can cope with stress? Wanting to smoke may cause stress, and stress can make you want to smoke. Find ways to manage your stress. Relaxation techniques can help. For example:  Breathe slowly and deeply, in through your nose and out through your mouth.  Listen to soothing, relaxing music.  Talk with a family member or friend about your stress.  Light a candle.  Soak in a bath or take a shower.  Think about a peaceful place. What are some ways I can prevent weight gain? Be aware that many people gain weight after they quit smoking. However, not everyone does. To keep from gaining weight, have a plan in place before you quit and stick to the plan after you quit. Your plan should include:  Having healthy snacks. When you have a craving, it may help to: ? Eat plain popcorn, crunchy carrots, celery, or other cut vegetables. ? Chew sugar-free gum.  Changing how you eat: ? Eat small portion sizes at meals. ? Eat 4-6 small meals throughout the day instead of 1-2 large meals a day. ? Be mindful when you eat. Do not watch television or do other things that might distract you as you eat.  Exercising regularly: ? Make time to exercise each day. If you do not have time for a long workout, do short bouts of exercise for 5-10 minutes several times a day. ? Do some form of strengthening exercise, like weight lifting, and some form of aerobic exercise, like running or swimming.  Drinking plenty of water or other low-calorie  or no-calorie drinks. Drink 6-8 glasses of water daily, or as much as instructed by your health care provider. Summary  Quitting smoking is a physical and mental challenge. You will face cravings, withdrawal symptoms, and temptation to smoke again. Preparation can help you as you go through these challenges.  You can cope with cravings by keeping your mouth busy (such as by chewing gum), keeping your body and hands busy, and making calls to family, friends, or a helpline for people who want to quit smoking.  You  can cope with withdrawal symptoms by avoiding places where people smoke, avoiding drinks with caffeine, and getting plenty of rest.  Ask your health care provider about the different ways to prevent weight gain, avoid stress, and handle social situations. This information is not intended to replace advice given to you by your health care provider. Make sure you discuss any questions you have with your health care provider. Document Revised: 02/27/2017 Document Reviewed: 03/14/2016 Elsevier Patient Education  [employer].      If you have lab work done today you will be contacted with your lab results within the next 2 weeks.  If you have not heard from [place] then please contact us. The fastest way to get your results is to register for My Chart.   IF you received an x-ray today, you will receive an invoice from [facility]. Please contact [facility] at [phone] with questions or concerns regarding your invoice.   IF you received labwork today, you will receive an invoice from [facility]. Please contact [facility] at [phone] with questions or concerns regarding your invoice.   Our billing staff will not be able to assist you with questions regarding bills from these companies.  You will be contacted with the lab results as soon as they are available. The fastest way to get your results is to activate your My Chart account. Instructions are located on the  last page of this paperwork. If you have not heard from [place] regarding the results in 2 weeks, please contact this office.

## 2019-08-10 ENCOUNTER — Ambulatory Visit: Payer: Medicare Other | Admitting: Family Medicine

## 2019-08-15 ENCOUNTER — Ambulatory Visit: Payer: Medicare Other | Admitting: Family Medicine

## 2019-12-21 ENCOUNTER — Ambulatory Visit: Payer: Medicare Other | Admitting: Medical

## 2020-01-31 ENCOUNTER — Other Ambulatory Visit: Payer: Self-pay | Admitting: Family Medicine

## 2020-01-31 DIAGNOSIS — E785 Hyperlipidemia, unspecified: Secondary | ICD-10-CM

## 2020-02-16 DIAGNOSIS — F32A Depression, unspecified: Secondary | ICD-10-CM | POA: Insufficient documentation

## 2020-02-16 DIAGNOSIS — F1020 Alcohol dependence, uncomplicated: Secondary | ICD-10-CM | POA: Insufficient documentation

## 2020-05-04 ENCOUNTER — Ambulatory Visit: Payer: Medicare Other | Admitting: Family Medicine

## 2020-06-04 ENCOUNTER — Other Ambulatory Visit: Payer: Self-pay

## 2020-06-05 ENCOUNTER — Ambulatory Visit: Payer: Medicare Other | Admitting: Family Medicine

## 2020-06-05 DIAGNOSIS — K219 Gastro-esophageal reflux disease without esophagitis: Secondary | ICD-10-CM | POA: Insufficient documentation

## 2020-06-25 ENCOUNTER — Telehealth: Payer: Self-pay | Admitting: Family Medicine

## 2020-06-25 ENCOUNTER — Encounter: Payer: Self-pay | Admitting: Family Medicine

## 2020-06-25 NOTE — Telephone Encounter (Signed)
Pt was no show for appt 06/05/2020 for new patient. Has not rescheduled. 1st occurrence. Fee waived. Letter mailed.

## 2020-07-05 ENCOUNTER — Other Ambulatory Visit: Payer: Self-pay | Admitting: Family Medicine

## 2020-07-05 DIAGNOSIS — E785 Hyperlipidemia, unspecified: Secondary | ICD-10-CM

## 2020-07-05 NOTE — Telephone Encounter (Signed)
   Notes to clinic Not a provider we approve rx for.  

## 2020-07-24 ENCOUNTER — Other Ambulatory Visit: Payer: Self-pay

## 2020-07-24 DIAGNOSIS — E785 Hyperlipidemia, unspecified: Secondary | ICD-10-CM

## 2020-07-24 MED ORDER — ATORVASTATIN CALCIUM 10 MG PO TABS: 10.0000 mg | ORAL_TABLET | Freq: Every day | ORAL | 0 refills | Status: DC

## 2020-08-30 ENCOUNTER — Other Ambulatory Visit: Payer: Self-pay | Admitting: Family Medicine

## 2020-08-30 DIAGNOSIS — E785 Hyperlipidemia, unspecified: Secondary | ICD-10-CM

## 2020-08-31 NOTE — Telephone Encounter (Signed)
Pt needs an appt for refill. 

## 2020-09-16 ENCOUNTER — Other Ambulatory Visit: Payer: Self-pay | Admitting: Family Medicine

## 2020-09-16 DIAGNOSIS — E785 Hyperlipidemia, unspecified: Secondary | ICD-10-CM

## 2020-12-06 DIAGNOSIS — G894 Chronic pain syndrome: Secondary | ICD-10-CM | POA: Insufficient documentation

## 2020-12-06 DIAGNOSIS — F3342 Major depressive disorder, recurrent, in full remission: Secondary | ICD-10-CM | POA: Insufficient documentation

## 2021-01-15 ENCOUNTER — Other Ambulatory Visit: Payer: Self-pay

## 2021-01-15 ENCOUNTER — Encounter: Payer: Self-pay | Admitting: Family Medicine

## 2021-01-15 ENCOUNTER — Ambulatory Visit (INDEPENDENT_AMBULATORY_CARE_PROVIDER_SITE_OTHER): Payer: Medicare Other | Admitting: Family Medicine

## 2021-01-15 VITALS — BP 124/86 | HR 89 | Temp 97.8°F | Ht 71.0 in | Wt 193.6 lb

## 2021-01-15 DIAGNOSIS — Z23 Encounter for immunization: Secondary | ICD-10-CM | POA: Diagnosis not present

## 2021-01-15 DIAGNOSIS — E785 Hyperlipidemia, unspecified: Secondary | ICD-10-CM

## 2021-01-15 DIAGNOSIS — M5136 Other intervertebral disc degeneration, lumbar region: Secondary | ICD-10-CM | POA: Diagnosis not present

## 2021-01-15 DIAGNOSIS — F1910 Other psychoactive substance abuse, uncomplicated: Secondary | ICD-10-CM

## 2021-01-15 DIAGNOSIS — M5030 Other cervical disc degeneration, unspecified cervical region: Secondary | ICD-10-CM | POA: Diagnosis not present

## 2021-01-15 DIAGNOSIS — Z114 Encounter for screening for human immunodeficiency virus [HIV]: Secondary | ICD-10-CM

## 2021-01-15 DIAGNOSIS — F32A Depression, unspecified: Secondary | ICD-10-CM

## 2021-01-15 DIAGNOSIS — F1020 Alcohol dependence, uncomplicated: Secondary | ICD-10-CM

## 2021-01-15 DIAGNOSIS — R7303 Prediabetes: Secondary | ICD-10-CM

## 2021-01-15 DIAGNOSIS — G894 Chronic pain syndrome: Secondary | ICD-10-CM

## 2021-01-15 DIAGNOSIS — F5101 Primary insomnia: Secondary | ICD-10-CM

## 2021-01-15 DIAGNOSIS — J449 Chronic obstructive pulmonary disease, unspecified: Secondary | ICD-10-CM

## 2021-01-15 DIAGNOSIS — M51369 Other intervertebral disc degeneration, lumbar region without mention of lumbar back pain or lower extremity pain: Secondary | ICD-10-CM

## 2021-01-15 DIAGNOSIS — Z1159 Encounter for screening for other viral diseases: Secondary | ICD-10-CM

## 2021-01-15 LAB — LIPID PANEL
Cholesterol: 214 mg/dL — ABNORMAL HIGH (ref 0–200)
HDL: 71.4 mg/dL (ref >39.00)
LDL Cholesterol: 129 mg/dL — ABNORMAL HIGH (ref 0–99)
NonHDL: 142.48
Total CHOL/HDL Ratio: 3
Triglycerides: 65 mg/dL (ref 0.0–149.0)
VLDL: 13 mg/dL (ref 0.0–40.0)

## 2021-01-15 LAB — HEMOGLOBIN A1C: Hgb A1c MFr Bld: 6 % (ref 4.6–6.5)

## 2021-01-15 MED ORDER — TRAZODONE HCL 100 MG PO TABS: 150.0000 mg | ORAL_TABLET | Freq: Every day | ORAL | 3 refills | Status: AC

## 2021-01-15 MED ORDER — OXYCODONE-ACETAMINOPHEN 7.5-325 MG PO TABS
1.0000 | ORAL_TABLET | Freq: Three times a day (TID) | ORAL | 0 refills | Status: DC | PRN
Start: 2021-01-15 — End: 2021-02-14

## 2021-01-15 NOTE — Progress Notes (Signed)
[facility] [address] [place] [address] Dept: [phone] Dept Fax: [phone]  New Patient Office Visit  Subjective:    Patient ID: Lucia Gaskins, male    DOB: 08-10-1960, 60 y.o..   MRN: [id number]  Chief Complaint  Patient presents with   Establish Care    NP- establish care/meds.  C/o having RT wrist pain (had carpel tunnel surgery 20 yrs ago),   ? Spot on pancreas that was seen 1 year ago.   Wants flu shot today.     History of Present Illness:  Patient is in today to establish care. Mr. Billey was born in [place], [place]. His family moved to [place] for about 10-11 years when he was a young child, but then returned to [facility]. He has lived in the [place] since then. Mr. Maule has been separated from his wife for ~ 20 years, but they have never divorced. He has a So. He has three children (36, 33, 32) and 10 grandchildren. Mr. Dirosa is very involved in his church ([employer] [place]). He previously had worked in [employer]. 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  Mr. Hayward admits to rare use of cigarettes. He notes that previously he smoked when he drank alcohol. When he stopped drinking, he notes this significantly curtailed his tobacco use. Mr. Deason states he quit drinking 30 years ago. He denies drug use.  Mr. Batterton notes he has a history of childhood asthma. He states he grew out of this over time. He notes that he is currently using a Breo Ellipta inhaler. He states this was for COPD.  Mr. Ellers has a history of lower back pain. He notes that he had a spinal fusion, but now has chronic low back pain. He denies any sciatica associated with this. He also notes that he has a history of cervical disc disease. He states he was previously recommended to have surgery for this, but did not follow through for that.  He notes that his pain has been being managed with Percocet 7.5-325 mg twice a day.   Mr. Dion has had some sleep difficulties, potentially related to underlying depression in the past. He notes that he was seen by a psychiatrist in the past. He was eventually placed on mirtazapine and trazodone for sleep.  Past Medical History: Patient Active Problem List   Diagnosis Date Noted   Polysubstance abuse ([place]) 01/15/2021   Recurrent major depressive disorder, in full remission ([place]) 12/06/2020   Chronic pain syndrome 12/06/2020   Gastroesophageal reflux disease 06/05/2020   Alcohol use disorder, severe, dependence ([place]) 02/16/2020   Primary insomnia 06/02/2019   Prediabetes 06/02/2019   COPD (chronic obstructive pulmonary disease) ([place]) 06/02/2019   Hyperlipidemia 06/02/2019   Rotator cuff syndrome of left shoulder 06/05/2015   Lumbar degenerative disc disease 09/06/2013   Degenerative disc disease, cervical 09/06/2013   Past Surgical History:  Procedure Laterality Date   APPENDECTOMY     CARPAL TUNNEL RELEASE Right    ELBOW SURGERY Right    LUMBAR FUSION     Lumbar, L4-L5   Family History  Problem Relation Age of Onset   Cancer Mother        Multiple myeloma   Heart disease Father    Diabetes Father    Hyperlipidemia Father    Heart disease Sister    Cancer Sister  Pancreatic   Cancer Niece        Breast   Outpatient Medications Prior to Visit  Medication Sig Dispense Refill   atorvastatin (LIPITOR) 20 MG tablet Take 20 mg by mouth at bedtime.     fluticasone furoate-vilanterol (BREO ELLIPTA) 100-25 MCG/INH AEPB Inhale into the lungs.     mirtazapine (REMERON) 45 MG tablet Take 45 mg by mouth at bedtime.     oxyCODONE-acetaminophen (PERCOCET) 7.5-325 MG tablet TAKE 1 TABLET BY MOUTH TWICE A DAY AS NEEDED FOR PAIN     traZODone (DESYREL) 100 MG tablet Take 1 tablet (100 mg total) by mouth at bedtime. 90 tablet 1   atorvastatin (LIPITOR) 10 MG tablet Take 1 tablet  (10 mg total) by mouth daily. 90 tablet 0   No facility-administered medications prior to visit.   Allergies  Allergen Reactions   Amoxicillin-Pot Clavulanate Shortness Of Breath   Other Other (See Comments)    unsure Pollen- runny nose, wheezing, itching   Sm Non-Asprin Sinus [Pseudoephedrine-Acetaminophen] Nausea Only   Aspirin Other (See Comments)    GI upset GI upset    Objective:   Today's Vitals   01/15/21 0918  BP: 124/86  Pulse: 89  Temp: 97.8 F (36.6 C)  TempSrc: Temporal  SpO2: 95%  Weight: 193 lb 9.6 oz (87.8 kg)  Height: 5' 11"  (1.803 m)   Body mass index is 27 kg/m.   General: Well developed, well nourished. No acute distress. Psych: Alert and oriented. Normal mood and affect.  Health Maintenance Due  Topic Date Due   COVID-19 Vaccine (1) Never done   Hepatitis C Screening  Never done   Zoster Vaccines- Shingrix (1 of 2) Never done   Imaging: Lumbar spine x-ray (06/23/2019) IMPRESSION:   1.  No acute findings.    2.  Status post posterior lumbar surgical fixation L4-S1   3.  Mild degenerative disc disease L3-4    Cervical Spine x-ray (06/23/2019) IMPRESSION:   1.  No acute findings.    2.  Degenerative disc disease mainly at C4-5     Assessment & Plan:   1. Chronic pain syndrome 2. Lumbar degenerative disc disease 3. Degenerative disc disease, cervical I spent time reviewing available medical records. Including more remote CT and MRI scan results. It is clear that Mr. Ciolek does have cervical and lumbar disc issues. He likely has some radiculopathy related to these issues. I did request that he sign record releases to obtain more detail about his prior medical care and work-up for these various issues. When checking his PDMP, he has only received 20 tabs in the past year. When I questioned him about this, he gave some vague responses as to his previous provider not wanting to have to deal with paperwork regarding opioid prescribing. I was  agreeable to provide a small amount of Percocet today, while completing further review of his issues.  - oxyCODONE-acetaminophen (PERCOCET) 7.5-325 MG tablet; Take 1 tablet by mouth every 8 (eight) hours as needed for severe pain.  Dispense: 20 tablet; Refill: 0  4. Prediabetes By history, Mr. Hanway has had an elevated A1c. We will reassess this.  - Hemoglobin A1c  5. Chronic obstructive pulmonary disease, unspecified COPD type ([facility]) By history, Mr. Rehberg has COPD, but he minimizes his tobacco use. He is currently on a Breo Ellipta inhaler. I will await his prior medical records to assess this further.  6. Hyperlipidemia, unspecified hyperlipidemia type By history, Mr. Mallozzi has had elevated cholesterol.  He is managed on atorvastatin 40 mg daily (patient noted this was 20, but recent discharge note disagrees)  - Lipid panel  7. Primary insomnia Despite his history, review of recent hospital discharge notes shows he had been told to stop using mirtazipine. He was to remain on trazodone 150 mg qhs for sleep.  8. Alcohol use disorder, severe, dependence ([place]) Despite his claim to have quit alcohol 30 years ago, my review of available records shows a recent admission (11/2020) for alcohol detox through [facility].   9. Polysubstance abuse ([place]) Review of prior hospital notes reveals that Mr. Lyerly has a history of cocaine use. Recent urine drug screens show positive for marijuana. Mr. Sultana says he does not feel marijuana is a drug. These would seriously impact my decision to treat further with any opioids, as this patient appears to be at great risk for opioid abuse/addiction.  10. Encounter for hepatitis C screening test for low risk patient  - HCV Ab w Reflex to Quant PCR  11. Screening for HIV (human immunodeficiency virus)  - HIV Antibody (routine testing w rflx)  12. Need for Tdap vaccination  - Tdap vaccine greater than or equal to 7yo IM  13. Need for influenza  vaccination  - Flu Vaccine QUAD 6+ mos PF IM (Fluarix Quad PF)  Haydee Salter, MD

## 2021-01-16 LAB — HCV INTERPRETATION

## 2021-01-16 LAB — HCV AB W REFLEX TO QUANT PCR: HCV Ab: <0.1 s/co ratio (ref 0.0–0.9)

## 2021-01-16 LAB — HIV ANTIBODY (ROUTINE TESTING W REFLEX): HIV 1&2 Ab, 4th Generation: NONREACTIVE

## 2021-02-08 ENCOUNTER — Other Ambulatory Visit: Payer: Self-pay

## 2021-02-08 ENCOUNTER — Encounter: Payer: Self-pay | Admitting: Family Medicine

## 2021-02-08 ENCOUNTER — Ambulatory Visit (INDEPENDENT_AMBULATORY_CARE_PROVIDER_SITE_OTHER): Payer: Medicare Other | Admitting: Family Medicine

## 2021-02-08 VITALS — BP 115/76 | HR 86 | Temp 97.9°F | Ht 71.0 in | Wt 194.6 lb

## 2021-02-08 DIAGNOSIS — G894 Chronic pain syndrome: Secondary | ICD-10-CM

## 2021-02-08 DIAGNOSIS — M25512 Pain in left shoulder: Secondary | ICD-10-CM

## 2021-02-08 DIAGNOSIS — M545 Low back pain, unspecified: Secondary | ICD-10-CM | POA: Insufficient documentation

## 2021-02-08 MED ORDER — KETOROLAC TROMETHAMINE 60 MG/2ML IM SOLN
60.0000 mg | Freq: Once | INTRAMUSCULAR | Status: AC
  Administered 2021-02-08: 60 mg via INTRAMUSCULAR

## 2021-02-08 MED ORDER — METHOCARBAMOL 500 MG PO TABS: 500.0000 mg | ORAL_TABLET | Freq: Three times a day (TID) | ORAL | 0 refills | Status: DC | PRN

## 2021-02-08 NOTE — Progress Notes (Signed)
Established Patient Office Visit  Subjective:  Patient ID: Jackson Castillo, male    DOB: [birth year]  Age: 60 y.o. MRN: [id number]  CC:  Chief Complaint  Patient presents with  . Fall    Had a fall last month still in lots of pain all over body left side.     HPI Jackson Castillo presents for follow-up of a fall that he experienced on October 26.  He fell from a standing position and landed on his left side.  Seem to be okay after the fall but developed pain on his left side the next day and went to the emergency room for evaluation.  Chart review shows that multiple x-rays and CT scans were obtained during that visit.  Fortunately none of them showed any acute injury or fracture.  Continues to experience pain in his back with tightening of the muscles.  He is scheduled to see his primary care doctor on the 15th.  He has an appointment with Dr. Arrie Eastern next month.   Past Medical History:  Diagnosis Date  . COPD (chronic obstructive pulmonary disease) ([place])   . Hyperlipidemia   . Prediabetes     Past Surgical History:  Procedure Laterality Date  . APPENDECTOMY    . CARPAL TUNNEL RELEASE Right   . ELBOW SURGERY Right   . LUMBAR FUSION     Lumbar, L4-L5    Family History  Problem Relation Age of Onset  . Cancer Mother        Multiple myeloma  . Heart disease Father   . Diabetes Father   . Hyperlipidemia Father   . Heart disease Sister   . Cancer Sister        Pancreatic  . Cancer Niece        Breast    Social History   Socioeconomic History  . Marital status: Married    Spouse name: Not on file  . Number of children: 3  . Years of education: Not on file  . Highest education level: Not on file  Occupational History  . Occupation: Unemployed  Tobacco Use  . Smoking status: Some Days    Types: Cigarettes  . Smokeless tobacco: Never  . Tobacco comments:    About once a month.  Vaping Use  . Vaping Use: Never used  Substance and Sexual Activity  . Alcohol use:  Yes    Comment: occasionally  . Drug use: Yes    Types: Marijuana  . Sexual activity: Yes  Other Topics Concern  . Not on file  Social History Narrative   Separated from wife for 20 years. Has a significant other.   Social Determinants of Health   Financial Resource Strain: Not on file  Food Insecurity: Not on file  Transportation Needs: Not on file  Physical Activity: Not on file  Stress: Not on file  Social Connections: Not on file  Intimate Partner Violence: Not on file    Outpatient Medications Prior to Visit  Medication Sig Dispense Refill  . atorvastatin (LIPITOR) 20 MG tablet Take 20 mg by mouth at bedtime.    . fluticasone furoate-vilanterol (BREO ELLIPTA) 100-25 MCG/INH AEPB Inhale into the lungs.    Marland Kitchen oxyCODONE-acetaminophen (PERCOCET) 7.5-325 MG tablet TAKE 1 TABLET BY MOUTH TWICE A DAY AS NEEDED FOR PAIN    . oxyCODONE-acetaminophen (PERCOCET) 7.5-325 MG tablet Take 1 tablet by mouth every 8 (eight) hours as needed for severe pain. 20 tablet 0  . traZODone (DESYREL) 100 MG tablet Take  1.5 tablets (150 mg total) by mouth at bedtime. 45 tablet 3   No facility-administered medications prior to visit.    Allergies  Allergen Reactions  . Amoxicillin-Pot Clavulanate Shortness Of Breath  . Other Other (See Comments)    unsure Pollen- runny nose, wheezing, itching  . Sm Non-Asprin Sinus [Pseudoephedrine-Acetaminophen] Nausea Only  . Aspirin Other (See Comments)    GI upset GI upset     ROS Review of Systems  Constitutional:  Negative for chills, diaphoresis, fatigue, fever and unexpected weight change.  HENT: Negative.    Eyes:  Negative for photophobia and visual disturbance.  Respiratory: Negative.    Cardiovascular: Negative.   Gastrointestinal: Negative.   Endocrine: Negative for polyphagia and polyuria.  Genitourinary: Negative.   Musculoskeletal:  Positive for arthralgias and back pain. Negative for gait problem, joint swelling and myalgias.   Neurological:  Negative for speech difficulty and weakness.     Objective:    Physical Exam Vitals and nursing note reviewed.  Constitutional:      General: He is not in acute distress.    Appearance: Normal appearance. He is normal weight. He is not ill-appearing, toxic-appearing or diaphoretic.  HENT:     Head: Normocephalic and atraumatic.     Right Ear: External ear normal.     Left Ear: External ear normal.  Eyes:     General: No scleral icterus.       Right eye: No discharge.        Left eye: No discharge.     Extraocular Movements: Extraocular movements intact.     Conjunctiva/sclera: Conjunctivae normal.     Pupils: Pupils are equal, round, and reactive to light.  Cardiovascular:     Rate and Rhythm: Normal rate and regular rhythm.  Pulmonary:     Effort: Pulmonary effort is normal.     Breath sounds: Normal breath sounds.  Musculoskeletal:     Left shoulder: Tenderness present.     Cervical back: No rigidity or tenderness.     Thoracic back: Tenderness present. Decreased range of motion.     Lumbar back: Tenderness present. No bony tenderness. Decreased range of motion. Negative right straight leg raise test and negative left straight leg raise test.  Lymphadenopathy:     Cervical: No cervical adenopathy.  Skin:    General: Skin is warm and dry.  Neurological:     Mental Status: He is alert and oriented to person, place, and time.    BP 115/76 (BP Location: Right Arm, Patient Position: Sitting, Cuff Size: Normal)   Pulse 86   Temp 97.9 F (36.6 C) (Temporal)   Ht 5' 11" (1.803 m)   Wt 194 lb 9.6 oz (88.3 kg)   SpO2 96%   BMI 27.14 kg/m  Wt Readings from Last 3 Encounters:  02/08/21 194 lb 9.6 oz (88.3 kg)  01/15/21 193 lb 9.6 oz (87.8 kg)  07/27/19 188 lb (85.3 kg)     Health Maintenance Due  Topic Date Due  . Pneumococcal Vaccine 92-61 Years old (1 - PCV) Never done  . Zoster Vaccines- Shingrix (1 of 2) Never done    There are no preventive  care reminders to display for this patient.  No results found for: TSH Lab Results  Component Value Date   WBC 7.4 11/30/2009   HGB 13.6 11/30/2009   HCT 38.7 (L) 11/30/2009   MCV 89.2 11/30/2009   PLT 203 11/30/2009   No results found for: NA, K, CHLORIDE, CO2,  GLUCOSE, BUN, CREATININE, BILITOT, ALKPHOS, AST, ALT, PROT, ALBUMIN, CALCIUM, ANIONGAP, EGFR, GFR Lab Results  Component Value Date   CHOL 214 (H) 01/15/2021   Lab Results  Component Value Date   HDL 71.40 01/15/2021   Lab Results  Component Value Date   LDLCALC 129 (H) 01/15/2021   Lab Results  Component Value Date   TRIG 65.0 01/15/2021   Lab Results  Component Value Date   CHOLHDL 3 01/15/2021   Lab Results  Component Value Date   HGBA1C 6.0 01/15/2021      Assessment & Plan:   Problem List Items Addressed This Visit       Other   Chronic pain syndrome - Primary   Relevant Medications   ketorolac (TORADOL) injection 60 mg (Start on 02/08/2021  4:15 PM)   methocarbamol (ROBAXIN) 500 MG tablet   Left shoulder pain   Relevant Medications   ketorolac (TORADOL) injection 60 mg (Start on 02/08/2021  4:15 PM)   methocarbamol (ROBAXIN) 500 MG tablet   Low back pain   Relevant Medications   ketorolac (TORADOL) injection 60 mg (Start on 02/08/2021  4:15 PM)   methocarbamol (ROBAXIN) 500 MG tablet    Meds ordered this encounter  Medications  . ketorolac (TORADOL) injection 60 mg  . methocarbamol (ROBAXIN) 500 MG tablet    Sig: Take 1 tablet (500 mg total) by mouth every 8 (eight) hours as needed for muscle spasms.    Dispense:  30 tablet    Refill:  0    Follow-up: Return Follow up with Dr Gena Fray next week..  Also follow up with Dr. Arrie Eastern as scheduled.   Libby Maw, MD

## 2021-02-11 ENCOUNTER — Encounter: Payer: Self-pay | Admitting: Family Medicine

## 2021-02-12 ENCOUNTER — Ambulatory Visit: Payer: Medicare Other | Admitting: Family Medicine

## 2021-02-14 ENCOUNTER — Other Ambulatory Visit: Payer: Self-pay

## 2021-02-14 ENCOUNTER — Telehealth: Payer: Self-pay | Admitting: Family Medicine

## 2021-02-14 ENCOUNTER — Ambulatory Visit (INDEPENDENT_AMBULATORY_CARE_PROVIDER_SITE_OTHER): Payer: Medicare Other | Admitting: Family Medicine

## 2021-02-14 ENCOUNTER — Encounter: Payer: Self-pay | Admitting: Family Medicine

## 2021-02-14 VITALS — BP 120/84 | HR 79 | Temp 97.7°F | Ht 71.0 in | Wt 190.2 lb

## 2021-02-14 DIAGNOSIS — M5030 Other cervical disc degeneration, unspecified cervical region: Secondary | ICD-10-CM

## 2021-02-14 DIAGNOSIS — M5136 Other intervertebral disc degeneration, lumbar region: Secondary | ICD-10-CM

## 2021-02-14 DIAGNOSIS — F1290 Cannabis use, unspecified, uncomplicated: Secondary | ICD-10-CM | POA: Insufficient documentation

## 2021-02-14 DIAGNOSIS — G894 Chronic pain syndrome: Secondary | ICD-10-CM

## 2021-02-14 DIAGNOSIS — T07XXXA Unspecified multiple injuries, initial encounter: Secondary | ICD-10-CM | POA: Diagnosis not present

## 2021-02-14 MED ORDER — METHOCARBAMOL 500 MG PO TABS: 500.0000 mg | ORAL_TABLET | Freq: Three times a day (TID) | ORAL | 0 refills | Status: AC | PRN

## 2021-02-14 NOTE — Telephone Encounter (Signed)
Spoke to pharmacy, insurance didn't want to cover the Methocarbamol but with [facility] it will be$10  and they are getting it ready for patient.  Call him and he was stating that if his insurance don't cover the medication without him paying nothing than he won't get the medication.  He said thank you and hung up the phone.  Dm/cma
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
## 2021-02-14 NOTE — Telephone Encounter (Signed)
Will call pharmacy to get information to do PA.  Dm/cma

## 2021-02-18 ENCOUNTER — Ambulatory Visit: Payer: Medicare Other | Admitting: Family Medicine

## 2021-05-17 ENCOUNTER — Ambulatory Visit: Payer: Medicare Other | Admitting: Family Medicine

## 2021-10-24 ENCOUNTER — Telehealth: Payer: Self-pay | Admitting: Family Medicine

## 2021-10-24 NOTE — Telephone Encounter (Signed)
Left message for patient to call back and schedule Medicare Annual Wellness Visit (AWV).   Please offer to do virtually or by telephone.  Left office number and my jabber [phone].  Due AWVS 09/28/21 per [place]  Please schedule at anytime with Nurse Health Advisor.

## 2022-11-07 ENCOUNTER — Telehealth: Payer: Self-pay | Admitting: Family Medicine

## 2022-11-07 NOTE — Telephone Encounter (Signed)
Called patient to schedule next available CPE, patient did not answer and was unable to leave VM.
